# Patient Record
Sex: Male | Born: 1960 | Race: Black or African American | Hispanic: No | Marital: Single | Smoking: Current every day smoker
Health system: Southern US, Community
[De-identification: ages and names within clinical notes are randomized; demographics above are authoritative.]

## PROBLEM LIST (undated history)

## (undated) DIAGNOSIS — I10 Essential (primary) hypertension: Secondary | ICD-10-CM

## (undated) DIAGNOSIS — K219 Gastro-esophageal reflux disease without esophagitis: Secondary | ICD-10-CM

---

## 2015-11-17 ENCOUNTER — Observation Stay (HOSPITAL_COMMUNITY)
Admission: EM | Admit: 2015-11-17 | Discharge: 2015-11-19 | Disposition: A | Discharge status: Home / Self Care | Payer: BLUE CROSS/BLUE SHIELD | Attending: Surgery | Admitting: Surgery

## 2015-11-17 ENCOUNTER — Encounter (HOSPITAL_COMMUNITY): Payer: Self-pay | Admitting: Emergency Medicine

## 2015-11-17 ENCOUNTER — Emergency Department (HOSPITAL_COMMUNITY): Payer: BLUE CROSS/BLUE SHIELD

## 2015-11-17 DIAGNOSIS — K353 Acute appendicitis with localized peritonitis: Secondary | ICD-10-CM | POA: Diagnosis not present

## 2015-11-17 DIAGNOSIS — I4891 Unspecified atrial fibrillation: Secondary | ICD-10-CM | POA: Diagnosis not present

## 2015-11-17 DIAGNOSIS — I10 Essential (primary) hypertension: Secondary | ICD-10-CM | POA: Insufficient documentation

## 2015-11-17 DIAGNOSIS — F17210 Nicotine dependence, cigarettes, uncomplicated: Secondary | ICD-10-CM | POA: Diagnosis not present

## 2015-11-17 DIAGNOSIS — K219 Gastro-esophageal reflux disease without esophagitis: Secondary | ICD-10-CM | POA: Diagnosis not present

## 2015-11-17 DIAGNOSIS — Z79899 Other long term (current) drug therapy: Secondary | ICD-10-CM | POA: Insufficient documentation

## 2015-11-17 DIAGNOSIS — Z23 Encounter for immunization: Secondary | ICD-10-CM | POA: Insufficient documentation

## 2015-11-17 DIAGNOSIS — K388 Other specified diseases of appendix: Secondary | ICD-10-CM | POA: Diagnosis not present

## 2015-11-17 DIAGNOSIS — R079 Chest pain, unspecified: Secondary | ICD-10-CM | POA: Diagnosis present

## 2015-11-17 DIAGNOSIS — K3580 Unspecified acute appendicitis: Secondary | ICD-10-CM | POA: Diagnosis present

## 2015-11-17 HISTORY — DX: Gastro-esophageal reflux disease without esophagitis: K21.9

## 2015-11-17 HISTORY — DX: Essential (primary) hypertension: I10

## 2015-11-17 LAB — I-STAT TROPONIN, ED: Troponin i, poc: 0 ng/mL (ref 0.00–0.08)

## 2015-11-17 LAB — URINALYSIS, ROUTINE W REFLEX MICROSCOPIC
Bilirubin Urine: NEGATIVE
Glucose, UA: NEGATIVE mg/dL
Hgb urine dipstick: NEGATIVE
KETONES UR: NEGATIVE mg/dL
LEUKOCYTES UA: NEGATIVE
Nitrite: NEGATIVE
PROTEIN: NEGATIVE mg/dL
Specific Gravity, Urine: 1.018 (ref 1.005–1.030)
pH: 7 (ref 5.0–8.0)

## 2015-11-17 LAB — HEPATIC FUNCTION PANEL
ALT: 28 U/L (ref 17–63)
AST: 34 U/L (ref 15–41)
Albumin: 4.3 g/dL (ref 3.5–5.0)
Alkaline Phosphatase: 54 U/L (ref 38–126)
Bilirubin, Direct: <0.1 mg/dL — ABNORMAL LOW (ref 0.1–0.5)
TOTAL PROTEIN: 7.4 g/dL (ref 6.5–8.1)
Total Bilirubin: 0.7 mg/dL (ref 0.3–1.2)

## 2015-11-17 LAB — BASIC METABOLIC PANEL
Anion gap: 13 (ref 5–15)
BUN: 9 mg/dL (ref 6–20)
CALCIUM: 9.5 mg/dL (ref 8.9–10.3)
CO2: 23 mmol/L (ref 22–32)
CREATININE: 1.44 mg/dL — AB (ref 0.61–1.24)
Chloride: 105 mmol/L (ref 101–111)
GFR calc Af Amer: >60 mL/min (ref >60)
GFR, EST NON AFRICAN AMERICAN: 54 mL/min — AB (ref >60)
GLUCOSE: 115 mg/dL — AB (ref 65–99)
Potassium: 4.1 mmol/L (ref 3.5–5.1)
Sodium: 141 mmol/L (ref 135–145)

## 2015-11-17 LAB — LIPASE, BLOOD: LIPASE: 21 U/L (ref 11–51)

## 2015-11-17 LAB — CBC
HCT: 47.8 % (ref 39.0–52.0)
HEMOGLOBIN: 15.7 g/dL (ref 13.0–17.0)
MCH: 24.3 pg — ABNORMAL LOW (ref 26.0–34.0)
MCHC: 32.8 g/dL (ref 30.0–36.0)
MCV: 74 fL — ABNORMAL LOW (ref 78.0–100.0)
PLATELETS: 277 10*3/uL (ref 150–400)
RBC: 6.46 MIL/uL — AB (ref 4.22–5.81)
RDW: 14.5 % (ref 11.5–15.5)
WBC: 10.4 10*3/uL (ref 4.0–10.5)

## 2015-11-17 LAB — POC OCCULT BLOOD, ED: Fecal Occult Bld: POSITIVE — AB

## 2015-11-17 MED ORDER — SODIUM CHLORIDE 0.9 % IV BOLUS (SEPSIS)
1000.0000 mL | Freq: Once | INTRAVENOUS | Status: AC
  Administered 2015-11-17: 1000 mL via INTRAVENOUS

## 2015-11-17 MED ORDER — ONDANSETRON 4 MG PO TBDP
4.0000 mg | ORAL_TABLET | Freq: Once | ORAL | Status: AC
  Administered 2015-11-17: 4 mg via ORAL

## 2015-11-17 MED ORDER — FENTANYL CITRATE (PF) 100 MCG/2ML IJ SOLN
INTRAMUSCULAR | Status: AC
  Filled 2015-11-17: qty 2

## 2015-11-17 MED ORDER — IOHEXOL 300 MG/ML  SOLN
100.0000 mL | Freq: Once | INTRAMUSCULAR | Status: AC | PRN
  Administered 2015-11-17: 100 mL via INTRAVENOUS

## 2015-11-17 MED ORDER — SODIUM CHLORIDE 0.9 % IV SOLN
INTRAVENOUS | Status: DC
  Administered 2015-11-17: 22:00:00 via INTRAVENOUS

## 2015-11-17 MED ORDER — ONDANSETRON 4 MG PO TBDP
ORAL_TABLET | ORAL | Status: AC
  Filled 2015-11-17: qty 1

## 2015-11-17 MED ORDER — FENTANYL CITRATE (PF) 100 MCG/2ML IJ SOLN
50.0000 ug | Freq: Once | INTRAMUSCULAR | Status: AC
  Administered 2015-11-17: 50 ug via NASAL

## 2015-11-17 MED ORDER — ONDANSETRON HCL 4 MG/2ML IJ SOLN
4.0000 mg | Freq: Once | INTRAMUSCULAR | Status: AC
  Administered 2015-11-17: 4 mg via INTRAVENOUS
  Filled 2015-11-17: qty 2

## 2015-11-17 MED ORDER — HYDROMORPHONE HCL 1 MG/ML IJ SOLN
1.0000 mg | Freq: Once | INTRAMUSCULAR | Status: AC
  Administered 2015-11-17: 1 mg via INTRAVENOUS
  Filled 2015-11-17: qty 1

## 2015-11-17 NOTE — ED Provider Notes (Signed)
CSN: [id number]     Arrival date & time 11/17/15  1627 History   First MD Initiated Contact with Patient 11/17/15 2044     Chief Complaint  Patient presents with  . Chest Pain  . Back Pain     (Consider location/radiation/quality/duration/timing/severity/associated sxs/prior Treatment) HPI   Anthony Marks is a 55 y.o. male who presents for evaluation of central chest pain, radiating to the back and upper abdomen, which started yesterday and has been persistent. 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 He had several episodes of vomiting food colored material and several episodes of loose bowel movements with "blood". He denies similar symptoms. There's been no fever, chills, cough, weakness or dizziness. He smokes cigarettes. He does not drink alcohol or take illegal drugs. There are no other known modifying factors.   Past Medical History  Diagnosis Date  . Hypertension   . GERD (gastroesophageal reflux disease)    History reviewed. No pertinent past surgical history. No family history on file. Social History  Substance Use Topics  . Smoking status: Current Every Day Smoker -- 0.50 packs/day    Types: Cigarettes  . Smokeless tobacco: None  . Alcohol Use: No    Review of Systems  All other systems reviewed and are negative.     Allergies  Review of patient's allergies indicates no known allergies.  Home Medications   Prior to Admission medications   Not on File   BP 136/93 mmHg  Pulse 82  Temp(Src) 98.8 F (37.1 C) (Oral)  Resp 18  Ht 6\' 5"  (1.956 m)  Wt 245 lb (111.131 kg)  BMI 29.05 kg/m2  SpO2 98% Physical Exam  Constitutional: He is oriented to person, place, and time. He appears well-developed and well-nourished.  HENT:  Head: Normocephalic and atraumatic.  Right Ear: External ear normal.  Left Ear: External ear normal.  Eyes: Conjunctivae and EOM are normal. Pupils are  equal, round, and reactive to light.  Neck: Normal range of motion and phonation normal. Neck supple.  Cardiovascular: Normal rate, regular rhythm and normal heart sounds.   Pulmonary/Chest: Effort normal and breath sounds normal. He exhibits no bony tenderness.  Abdominal: Soft. He exhibits no distension. There is tenderness (right and left upper quadrant, mild). There is no rebound and no guarding.  Genitourinary:  Small amount of brown stool rectal vault. No rectal mass, tenderness, or impaction  Musculoskeletal: Normal range of motion.  Neurological: He is alert and oriented to person, place, and time. No cranial nerve deficit or sensory deficit. He exhibits normal muscle tone. Coordination normal.  Skin: Skin is warm, dry and intact.  Psychiatric: He has a normal mood and affect. His behavior is normal. Judgment and thought content normal.  Nursing note and vitals reviewed.   ED Course  Procedures (including critical care time) Medications  fentaNYL (SUBLIMAZE) 100 MCG/2ML injection (not administered)  ondansetron (ZOFRAN-ODT) 4 MG disintegrating tablet (not administered)  ondansetron (ZOFRAN) injection 4 mg (not administered)  HYDROmorphone (DILAUDID) injection 1 mg (not administered)  sodium chloride 0.9 % bolus 1,000 mL (not administered)  0.9 %  sodium chloride infusion (not administered)  fentaNYL (SUBLIMAZE) injection 50 mcg (50 mcg Nasal Given 11/17/15 1645)  ondansetron (ZOFRAN-ODT) disintegrating tablet 4 mg (4 mg Oral Given 11/17/15 1645)    Patient Vitals for the past 24 hrs:  BP Temp Temp src Pulse Resp SpO2 Height Weight  11/17/15 2019 136/93  mmHg 98.8 F (37.1 C) Oral 82 18 98 % - -  11/17/15 1639 (!) 143/105 mmHg 97.8 F (36.6 C) Oral 70 16 100 % 6\' 5"  (1.956 m) 245 lb (111.131 kg)   12:46 AM-Consult complete with Dr. Andrey Campanile. Patient case explained and discussed. He agrees to admit patient for further evaluation and treatment. Call ended at 0100   12:51 AM  Reevaluation with update and discussion. After initial assessment and treatment, an updated evaluation reveals no change in clinical status. He does now have focal RLQ tenderness. Anthony Marks      Labs Review Labs Reviewed  BASIC METABOLIC PANEL - Abnormal; Notable for the following:    Glucose, Bld 115 (*)    Creatinine, Ser 1.44 (*)    GFR calc non Af Amer 54 (*)    All other components within normal limits  CBC - Abnormal; Notable for the following:    RBC 6.46 (*)    MCV 74.0 (*)    MCH 24.3 (*)    All other components within normal limits  Rosezena Sensor, ED    Imaging Review Dg Chest 2 View  11/17/2015  CLINICAL DATA:  Abdominal and posterior thorax pain. Nausea, vomiting and diarrhea. Symptoms for 1 day. Initial encounter. EXAM: CHEST  2 VIEW COMPARISON:  None. FINDINGS: The lungs are clear. Heart size is normal. No pneumothorax or pleural effusion. No focal bony abnormality. IMPRESSION: No acute disease. Electronically Signed   By: Drusilla Kanner M.D.   On: 11/17/2015 17:39   I have personally reviewed and evaluated these images and lab results as part of my medical decision-making.   EKG Interpretation   Date/Time:  Thursday November 17 2015 21:00:49 EST Ventricular Rate:  74 PR Interval:  185 QRS Duration: 96 QT Interval:  378 QTC Calculation: 419 R Axis:   62 Text Interpretation:  Sinus rhythm Since last tracing of earlier today No  significant change was found Confirmed by Effie Shy  MD, Cale Bethard [phone]) on  11/17/2015 9:10:17 PM         MDM   Final diagnoses:  Acute appendicitis, unspecified acute appendicitis type    Abdominal pain with vomiting and diarrhea. CT imaging consistent with acute appendicitis, without perforation.  Nursing Notes Reviewed/ Care Coordinated, and agree without changes. Applicable Imaging Reviewed.  Interpretation of Laboratory Data incorporated into ED treatment  Plan- as per consulting general surgeon, Dr.  Leafy Half, MD 11/18/15 1306

## 2015-11-17 NOTE — ED Notes (Signed)
Pt states he started having substernal sharp chest pain yesterday that is radiating into his back and upper abd. Pt states he has vomitted once. Pt states he has had two episodes of bright red blood in his stool today also.

## 2015-11-18 ENCOUNTER — Observation Stay (HOSPITAL_COMMUNITY): Payer: BLUE CROSS/BLUE SHIELD | Admitting: Anesthesiology

## 2015-11-18 ENCOUNTER — Encounter (HOSPITAL_COMMUNITY): Payer: Self-pay | Admitting: Certified Registered Nurse Anesthetist

## 2015-11-18 ENCOUNTER — Encounter (HOSPITAL_COMMUNITY)
Admission: EM | Disposition: A | Discharge status: Home / Self Care | Payer: Self-pay | Source: Home / Self Care | Attending: Emergency Medicine

## 2015-11-18 DIAGNOSIS — I4891 Unspecified atrial fibrillation: Secondary | ICD-10-CM | POA: Diagnosis present

## 2015-11-18 DIAGNOSIS — K3580 Unspecified acute appendicitis: Secondary | ICD-10-CM | POA: Diagnosis present

## 2015-11-18 HISTORY — PX: LAPAROSCOPIC APPENDECTOMY: SHX408

## 2015-11-18 LAB — BASIC METABOLIC PANEL
ANION GAP: 7 (ref 5–15)
BUN: 8 mg/dL (ref 6–20)
CHLORIDE: 107 mmol/L (ref 101–111)
CO2: 25 mmol/L (ref 22–32)
Calcium: 8.3 mg/dL — ABNORMAL LOW (ref 8.9–10.3)
Creatinine, Ser: 1.33 mg/dL — ABNORMAL HIGH (ref 0.61–1.24)
GFR calc non Af Amer: 59 mL/min — ABNORMAL LOW (ref >60)
GLUCOSE: 99 mg/dL (ref 65–99)
POTASSIUM: 3.7 mmol/L (ref 3.5–5.1)
Sodium: 139 mmol/L (ref 135–145)

## 2015-11-18 LAB — CBC
HEMATOCRIT: 42.9 % (ref 39.0–52.0)
HEMOGLOBIN: 13.8 g/dL (ref 13.0–17.0)
MCH: 24.2 pg — AB (ref 26.0–34.0)
MCHC: 32.2 g/dL (ref 30.0–36.0)
MCV: 75.1 fL — ABNORMAL LOW (ref 78.0–100.0)
Platelets: 236 10*3/uL (ref 150–400)
RBC: 5.71 MIL/uL (ref 4.22–5.81)
RDW: 14.6 % (ref 11.5–15.5)
WBC: 11.3 10*3/uL — ABNORMAL HIGH (ref 4.0–10.5)

## 2015-11-18 LAB — SURGICAL PCR SCREEN
MRSA, PCR: NEGATIVE
Staphylococcus aureus: POSITIVE — AB

## 2015-11-18 SURGERY — APPENDECTOMY, LAPAROSCOPIC
Anesthesia: General | Site: Abdomen

## 2015-11-18 MED ORDER — PROPOFOL 10 MG/ML IV BOLUS
INTRAVENOUS | Status: DC | PRN
  Administered 2015-11-18: 200 mg via INTRAVENOUS

## 2015-11-18 MED ORDER — DEXTROSE-NACL 5-0.9 % IV SOLN
INTRAVENOUS | Status: DC
  Administered 2015-11-18: 75 mL/h via INTRAVENOUS

## 2015-11-18 MED ORDER — ROCURONIUM BROMIDE 50 MG/5ML IV SOLN
INTRAVENOUS | Status: AC
  Filled 2015-11-18: qty 1

## 2015-11-18 MED ORDER — ROCURONIUM BROMIDE 100 MG/10ML IV SOLN
INTRAVENOUS | Status: DC | PRN
  Administered 2015-11-18: 30 mg via INTRAVENOUS

## 2015-11-18 MED ORDER — NICOTINE 14 MG/24HR TD PT24
14.0000 mg | MEDICATED_PATCH | Freq: Every day | TRANSDERMAL | Status: DC
  Administered 2015-11-19: 14 mg via TRANSDERMAL
  Filled 2015-11-18: qty 1

## 2015-11-18 MED ORDER — SUCCINYLCHOLINE CHLORIDE 20 MG/ML IJ SOLN
INTRAMUSCULAR | Status: AC
  Filled 2015-11-18: qty 1

## 2015-11-18 MED ORDER — LACTATED RINGERS IV SOLN
INTRAVENOUS | Status: DC | PRN
  Administered 2015-11-18: 10:00:00 via INTRAVENOUS

## 2015-11-18 MED ORDER — SODIUM CHLORIDE 0.9 % IR SOLN
Status: DC | PRN
  Administered 2015-11-18: 1000 mL

## 2015-11-18 MED ORDER — BUPIVACAINE HCL (PF) 0.25 % IJ SOLN
INTRAMUSCULAR | Status: AC
  Filled 2015-11-18: qty 30

## 2015-11-18 MED ORDER — PANTOPRAZOLE SODIUM 40 MG IV SOLR
40.0000 mg | Freq: Every day | INTRAVENOUS | Status: DC
  Administered 2015-11-18: 40 mg via INTRAVENOUS
  Filled 2015-11-18: qty 40

## 2015-11-18 MED ORDER — HYDROMORPHONE HCL 1 MG/ML IJ SOLN
0.5000 mg | INTRAMUSCULAR | Status: DC | PRN
  Administered 2015-11-18: 1 mg via INTRAVENOUS
  Filled 2015-11-18: qty 1

## 2015-11-18 MED ORDER — SUCCINYLCHOLINE CHLORIDE 20 MG/ML IJ SOLN
INTRAMUSCULAR | Status: DC | PRN
  Administered 2015-11-18: 100 mg via INTRAVENOUS

## 2015-11-18 MED ORDER — ONDANSETRON HCL 4 MG/2ML IJ SOLN
INTRAMUSCULAR | Status: DC | PRN
  Administered 2015-11-18: 4 mg via INTRAVENOUS

## 2015-11-18 MED ORDER — LACTATED RINGERS IV SOLN: INTRAVENOUS | Status: DC

## 2015-11-18 MED ORDER — NEOSTIGMINE METHYLSULFATE 10 MG/10ML IV SOLN
INTRAVENOUS | Status: DC | PRN
  Administered 2015-11-18: 4 mg via INTRAVENOUS

## 2015-11-18 MED ORDER — MIDAZOLAM HCL 2 MG/2ML IJ SOLN
INTRAMUSCULAR | Status: AC
  Filled 2015-11-18: qty 2

## 2015-11-18 MED ORDER — LIDOCAINE HCL (CARDIAC) 20 MG/ML IV SOLN
INTRAVENOUS | Status: AC
  Filled 2015-11-18: qty 5

## 2015-11-18 MED ORDER — ONDANSETRON 4 MG PO TBDP
4.0000 mg | ORAL_TABLET | Freq: Four times a day (QID) | ORAL | Status: DC | PRN
  Filled 2015-11-18: qty 1

## 2015-11-18 MED ORDER — SODIUM CHLORIDE 0.9 % IV SOLN
INTRAVENOUS | Status: AC
  Administered 2015-11-18: 03:00:00 via INTRAVENOUS

## 2015-11-18 MED ORDER — EPHEDRINE SULFATE 50 MG/ML IJ SOLN
INTRAMUSCULAR | Status: AC
  Filled 2015-11-18: qty 1

## 2015-11-18 MED ORDER — DIPHENHYDRAMINE HCL 50 MG/ML IJ SOLN: 12.5000 mg | Freq: Four times a day (QID) | INTRAMUSCULAR | Status: DC | PRN

## 2015-11-18 MED ORDER — GLYCOPYRROLATE 0.2 MG/ML IJ SOLN
INTRAMUSCULAR | Status: DC | PRN
  Administered 2015-11-18: 0.6 mg via INTRAVENOUS

## 2015-11-18 MED ORDER — PROPOFOL 10 MG/ML IV BOLUS
INTRAVENOUS | Status: AC
  Filled 2015-11-18: qty 20

## 2015-11-18 MED ORDER — 0.9 % SODIUM CHLORIDE (POUR BTL) OPTIME
TOPICAL | Status: DC | PRN
  Administered 2015-11-18: 1000 mL

## 2015-11-18 MED ORDER — DIPHENHYDRAMINE HCL 12.5 MG/5ML PO ELIX: 12.5000 mg | ORAL_SOLUTION | Freq: Four times a day (QID) | ORAL | Status: DC | PRN

## 2015-11-18 MED ORDER — ONDANSETRON HCL 4 MG/2ML IJ SOLN
4.0000 mg | Freq: Four times a day (QID) | INTRAMUSCULAR | Status: DC | PRN
  Filled 2015-11-18: qty 2

## 2015-11-18 MED ORDER — ONDANSETRON HCL 4 MG/2ML IJ SOLN
4.0000 mg | Freq: Four times a day (QID) | INTRAMUSCULAR | Status: DC | PRN
  Administered 2015-11-18: 4 mg via INTRAVENOUS

## 2015-11-18 MED ORDER — HYDROMORPHONE HCL 1 MG/ML IJ SOLN: 0.2500 mg | INTRAMUSCULAR | Status: DC | PRN

## 2015-11-18 MED ORDER — MEPERIDINE HCL 25 MG/ML IJ SOLN: 6.2500 mg | INTRAMUSCULAR | Status: DC | PRN

## 2015-11-18 MED ORDER — FENTANYL CITRATE (PF) 100 MCG/2ML IJ SOLN
INTRAMUSCULAR | Status: DC | PRN
  Administered 2015-11-18 (×2): 50 ug via INTRAVENOUS

## 2015-11-18 MED ORDER — ONDANSETRON HCL 4 MG/2ML IJ SOLN
INTRAMUSCULAR | Status: AC
Start: 2015-11-18 — End: 2015-11-18
  Filled 2015-11-18: qty 2

## 2015-11-18 MED ORDER — GLYCOPYRROLATE 0.2 MG/ML IJ SOLN
INTRAMUSCULAR | Status: AC
  Filled 2015-11-18: qty 3

## 2015-11-18 MED ORDER — OXYCODONE HCL 5 MG PO TABS
5.0000 mg | ORAL_TABLET | ORAL | Status: DC | PRN
  Administered 2015-11-18 – 2015-11-19 (×3): 10 mg via ORAL
  Filled 2015-11-18 (×3): qty 2

## 2015-11-18 MED ORDER — BUPIVACAINE HCL 0.25 % IJ SOLN
INTRAMUSCULAR | Status: DC | PRN
  Administered 2015-11-18: 30 mL

## 2015-11-18 MED ORDER — METRONIDAZOLE IN NACL 5-0.79 MG/ML-% IV SOLN
500.0000 mg | Freq: Three times a day (TID) | INTRAVENOUS | Status: DC
  Administered 2015-11-18 – 2015-11-19 (×4): 500 mg via INTRAVENOUS
  Filled 2015-11-18 (×7): qty 100

## 2015-11-18 MED ORDER — PHENYLEPHRINE 40 MCG/ML (10ML) SYRINGE FOR IV PUSH (FOR BLOOD PRESSURE SUPPORT)
PREFILLED_SYRINGE | INTRAVENOUS | Status: AC
  Filled 2015-11-18: qty 10

## 2015-11-18 MED ORDER — PNEUMOCOCCAL VAC POLYVALENT 25 MCG/0.5ML IJ INJ
0.5000 mL | INJECTION | INTRAMUSCULAR | Status: AC
  Administered 2015-11-19: 0.5 mL via INTRAMUSCULAR
  Filled 2015-11-18: qty 0.5

## 2015-11-18 MED ORDER — NEOSTIGMINE METHYLSULFATE 10 MG/10ML IV SOLN
INTRAVENOUS | Status: AC
  Filled 2015-11-18: qty 1

## 2015-11-18 MED ORDER — FENTANYL CITRATE (PF) 250 MCG/5ML IJ SOLN
INTRAMUSCULAR | Status: AC
  Filled 2015-11-18: qty 5

## 2015-11-18 MED ORDER — MORPHINE SULFATE (PF) 2 MG/ML IV SOLN
1.0000 mg | INTRAVENOUS | Status: DC | PRN
  Administered 2015-11-18 (×2): 2 mg via INTRAVENOUS
  Administered 2015-11-18: 3 mg via INTRAVENOUS
  Administered 2015-11-19: 2 mg via INTRAVENOUS
  Filled 2015-11-18 (×3): qty 1
  Filled 2015-11-18: qty 2

## 2015-11-18 MED ORDER — LACTATED RINGERS IV SOLN
INTRAVENOUS | Status: DC
  Administered 2015-11-18: 09:00:00 via INTRAVENOUS

## 2015-11-18 MED ORDER — DEXAMETHASONE SODIUM PHOSPHATE 10 MG/ML IJ SOLN
INTRAMUSCULAR | Status: AC
  Filled 2015-11-18: qty 1

## 2015-11-18 MED ORDER — LIDOCAINE HCL (CARDIAC) 20 MG/ML IV SOLN
INTRAVENOUS | Status: DC | PRN
  Administered 2015-11-18: 50 mg via INTRAVENOUS

## 2015-11-18 MED ORDER — DEXTROSE 5 % IV SOLN
2.0000 g | INTRAVENOUS | Status: DC
  Administered 2015-11-18 – 2015-11-19 (×2): 2 g via INTRAVENOUS
  Filled 2015-11-18 (×2): qty 2

## 2015-11-18 MED ORDER — PROMETHAZINE HCL 25 MG/ML IJ SOLN: 6.2500 mg | INTRAMUSCULAR | Status: DC | PRN

## 2015-11-18 MED ORDER — MIDAZOLAM HCL 5 MG/5ML IJ SOLN
INTRAMUSCULAR | Status: DC | PRN
  Administered 2015-11-18: 2 mg via INTRAVENOUS

## 2015-11-18 MED ORDER — HYDROMORPHONE HCL 1 MG/ML IJ SOLN
INTRAMUSCULAR | Status: AC
  Filled 2015-11-18: qty 1

## 2015-11-18 MED ORDER — DEXAMETHASONE SODIUM PHOSPHATE 10 MG/ML IJ SOLN
INTRAMUSCULAR | Status: DC | PRN
  Administered 2015-11-18: 10 mg via INTRAVENOUS

## 2015-11-18 MED ORDER — PROMETHAZINE HCL 25 MG/ML IJ SOLN
12.5000 mg | Freq: Four times a day (QID) | INTRAMUSCULAR | Status: DC | PRN
  Administered 2015-11-18: 12.5 mg via INTRAVENOUS
  Filled 2015-11-18: qty 1

## 2015-11-18 MED ORDER — HYDROMORPHONE HCL 1 MG/ML IJ SOLN
1.0000 mg | INTRAMUSCULAR | Status: DC | PRN
  Administered 2015-11-18: 1 mg via INTRAVENOUS

## 2015-11-18 SURGICAL SUPPLY — 46 items
APPLIER CLIP 5 13 M/L LIGAMAX5 (MISCELLANEOUS)
BENZOIN TINCTURE PRP APPL 2/3 (GAUZE/BANDAGES/DRESSINGS) ×2 IMPLANT
BLADE SURG ROTATE 9660 (MISCELLANEOUS) IMPLANT
CANISTER SUCTION 2500CC (MISCELLANEOUS) ×2 IMPLANT
CHLORAPREP W/TINT 26ML (MISCELLANEOUS) ×2 IMPLANT
CLIP APPLIE 5 13 M/L LIGAMAX5 (MISCELLANEOUS) IMPLANT
COVER SURGICAL LIGHT HANDLE (MISCELLANEOUS) ×2 IMPLANT
COVER TRANSDUCER ULTRASND (DRAPES) ×2 IMPLANT
DEVICE TROCAR PUNCTURE CLOSURE (ENDOMECHANICALS) ×2 IMPLANT
DRAPE LAPAROSCOPIC ABDOMINAL (DRAPES) ×2 IMPLANT
ELECT REM PT RETURN 9FT ADLT (ELECTROSURGICAL) ×2
ELECTRODE REM PT RTRN 9FT ADLT (ELECTROSURGICAL) ×1 IMPLANT
ENDOLOOP SUT PDS II  0 18 (SUTURE) ×3
ENDOLOOP SUT PDS II 0 18 (SUTURE) ×3 IMPLANT
GAUZE SPONGE 2X2 8PLY STRL LF (GAUZE/BANDAGES/DRESSINGS) ×1 IMPLANT
GLOVE BIO SURGEON STRL SZ7.5 (GLOVE) ×2 IMPLANT
GLOVE BIOGEL PI IND STRL 7.0 (GLOVE) ×2 IMPLANT
GLOVE BIOGEL PI IND STRL 7.5 (GLOVE) ×1 IMPLANT
GLOVE BIOGEL PI IND STRL 8 (GLOVE) ×2 IMPLANT
GLOVE BIOGEL PI INDICATOR 7.0 (GLOVE) ×2
GLOVE BIOGEL PI INDICATOR 7.5 (GLOVE) ×1
GLOVE BIOGEL PI INDICATOR 8 (GLOVE) ×2
GOWN STRL REUS W/ TWL LRG LVL3 (GOWN DISPOSABLE) ×2 IMPLANT
GOWN STRL REUS W/ TWL XL LVL3 (GOWN DISPOSABLE) ×1 IMPLANT
GOWN STRL REUS W/TWL LRG LVL3 (GOWN DISPOSABLE) ×2
GOWN STRL REUS W/TWL XL LVL3 (GOWN DISPOSABLE) ×1
KIT BASIN OR (CUSTOM PROCEDURE TRAY) ×2 IMPLANT
KIT ROOM TURNOVER OR (KITS) ×2 IMPLANT
NEEDLE INSUFFLATION 14GA 120MM (NEEDLE) ×2 IMPLANT
NS IRRIG 1000ML POUR BTL (IV SOLUTION) ×2 IMPLANT
PAD ARMBOARD 7.5X6 YLW CONV (MISCELLANEOUS) ×4 IMPLANT
SCISSORS LAP 5X35 DISP (ENDOMECHANICALS) ×2 IMPLANT
SET IRRIG TUBING LAPAROSCOPIC (IRRIGATION / IRRIGATOR) ×2 IMPLANT
SLEEVE ENDOPATH XCEL 5M (ENDOMECHANICALS) ×2 IMPLANT
SPECIMEN JAR SMALL (MISCELLANEOUS) ×2 IMPLANT
SPONGE GAUZE 2X2 STER 10/PKG (GAUZE/BANDAGES/DRESSINGS) ×1
STRIP CLOSURE SKIN 1/2X4 (GAUZE/BANDAGES/DRESSINGS) ×2 IMPLANT
SUT MNCRL AB 3-0 PS2 18 (SUTURE) ×2 IMPLANT
SUT SILK 2 0 SH (SUTURE) IMPLANT
TOWEL OR 17X24 6PK STRL BLUE (TOWEL DISPOSABLE) ×2 IMPLANT
TOWEL OR 17X26 10 PK STRL BLUE (TOWEL DISPOSABLE) ×2 IMPLANT
TRAY FOLEY CATH 16FR SILVER (SET/KITS/TRAYS/PACK) ×2 IMPLANT
TRAY LAPAROSCOPIC MC (CUSTOM PROCEDURE TRAY) ×2 IMPLANT
TROCAR XCEL NON-BLD 11X100MML (ENDOMECHANICALS) ×2 IMPLANT
TROCAR XCEL NON-BLD 5MMX100MML (ENDOMECHANICALS) ×2 IMPLANT
TUBING INSUFFLATION (TUBING) ×2 IMPLANT

## 2015-11-18 NOTE — Anesthesia Preprocedure Evaluation (Addendum)
Anesthesia Evaluation  Patient identified by MRN, date of birth, ID band Patient awake    Reviewed: Allergy & Precautions, NPO status , Patient's Chart, lab work & pertinent test results  Airway Mallampati: I  TM Distance: >3 FB Neck ROM: Full    Dental  (+) Missing, Dental Advisory Given,    Pulmonary Current Smoker,    breath sounds clear to auscultation       Cardiovascular hypertension, + dysrhythmias Atrial Fibrillation  Rhythm:Regular     Neuro/Psych negative neurological ROS  negative psych ROS   GI/Hepatic Neg liver ROS, GERD  Medicated,  Endo/Other  negative endocrine ROS  Renal/GU negative Renal ROS  negative genitourinary   Musculoskeletal negative musculoskeletal ROS (+)   Abdominal   Peds negative pediatric ROS (+)  Hematology negative hematology ROS (+)   Anesthesia Other Findings   Reproductive/Obstetrics negative OB ROS                            Lab Results  Component Value Date   WBC 11.3* 11/18/2015   HGB 13.8 11/18/2015   HCT 42.9 11/18/2015   MCV 75.1* 11/18/2015   PLT 236 11/18/2015   Lab Results  Component Value Date   CREATININE 1.33* 11/18/2015   BUN 8 11/18/2015   NA 139 11/18/2015   K 3.7 11/18/2015   CL 107 11/18/2015   CO2 25 11/18/2015   No results found for: INR, PROTIME  11/2015 EKG: normal sinus rhythm.   Anesthesia Physical Anesthesia Plan  ASA: III  Anesthesia Plan: General   Post-op Pain Management:    Induction: Intravenous  Airway Management Planned: Oral ETT  Additional Equipment:   Intra-op Plan:   Post-operative Plan: Extubation in OR  Informed Consent: I have reviewed the patients History and Physical, chart, labs and discussed the procedure including the risks, benefits and alternatives for the proposed anesthesia with the patient or authorized representative who has indicated his/her understanding and acceptance.    Dental advisory given  Plan Discussed with: CRNA  Anesthesia Plan Comments: (Mr. Barnabas ListerWashington denies HTN diagnosis.)       Anesthesia Quick Evaluation

## 2015-11-18 NOTE — Addendum Note (Signed)
Addendum  created 11/18/15 1243 by Epifanio LeschesKari L Miranda Garber, CRNA   Modules edited: Anesthesia Medication Administration

## 2015-11-18 NOTE — H&P (Signed)
Anthony Marks is an 55 y.o. male.   Chief Complaint: abd pain3 HPI: 55 yo male came to ED because of ongoing upper abd and back pain which started about 1.5 days ago. Had some n/v/diarrhea. Nothing like this in the past. No fever/chills. Also reports some small amount of blood in stool. No CP/SOB/orthopnea/PND. +smoker. No prior surgery on abd.   Past Medical History  Diagnosis Date  . Hypertension   . GERD (gastroesophageal reflux disease)     History reviewed. No pertinent past surgical history.  No family history on file. Social History:  reports that he has been smoking Cigarettes.  He has been smoking about 0.50 packs per day. He does not have any smokeless tobacco history on file. He reports that he does not drink alcohol. His drug history is not on file.  Allergies: No Known Allergies  Medications Prior to Admission  Medication Sig Dispense Refill  . diphenhydramine-acetaminophen (TYLENOL PM) 25-500 MG TABS tablet Take 1 tablet by mouth at bedtime as needed.    . ranitidine (ZANTAC) 150 MG tablet Take 150 mg by mouth daily as needed for heartburn.    . Sodium & Potassium Bicarbonate (ALKA-SELTZER GOLD PO) Take 1 tablet by mouth daily as needed. For cold symptoms      Results for orders placed or performed during the hospital encounter of 11/17/15 (from the past 48 hour(s))  Basic metabolic panel     Status: Abnormal   Collection Time: 11/17/15  4:36 PM  Result Value Ref Range   Sodium 141 135 - 145 mmol/L   Potassium 4.1 3.5 - 5.1 mmol/L   Chloride 105 101 - 111 mmol/L   CO2 23 22 - 32 mmol/L   Glucose, Bld 115 (H) 65 - 99 mg/dL   BUN 9 6 - 20 mg/dL   Creatinine, Ser 1.44 (H) 0.61 - 1.24 mg/dL   Calcium 9.5 8.9 - 10.3 mg/dL   GFR calc non Af Amer 54 (L) >60 mL/min   GFR calc Af Amer >60 >60 mL/min    Comment: (NOTE) The eGFR has been calculated using the CKD EPI equation. This calculation has not been validated in all clinical situations. eGFR's persistently <60  mL/min signify possible Chronic Kidney Disease.    Anion gap 13 5 - 15  CBC     Status: Abnormal   Collection Time: 11/17/15  4:36 PM  Result Value Ref Range   WBC 10.4 4.0 - 10.5 K/uL   RBC 6.46 (H) 4.22 - 5.81 MIL/uL   Hemoglobin 15.7 13.0 - 17.0 g/dL   HCT 47.8 39.0 - 52.0 %   MCV 74.0 (L) 78.0 - 100.0 fL   MCH 24.3 (L) 26.0 - 34.0 pg   MCHC 32.8 30.0 - 36.0 g/dL   RDW 14.5 11.5 - 15.5 %   Platelets 277 150 - 400 K/uL  Lipase, blood     Status: None   Collection Time: 11/17/15  4:36 PM  Result Value Ref Range   Lipase 21 11 - 51 U/L  Hepatic function panel     Status: Abnormal   Collection Time: 11/17/15  4:36 PM  Result Value Ref Range   Total Protein 7.4 6.5 - 8.1 g/dL   Albumin 4.3 3.5 - 5.0 g/dL   AST 34 15 - 41 U/L   ALT 28 17 - 63 U/L   Alkaline Phosphatase 54 38 - 126 U/L   Total Bilirubin 0.7 0.3 - 1.2 mg/dL   Bilirubin, Direct <0.1 (L) 0.1 -  0.5 mg/dL   Indirect Bilirubin NOT CALCULATED 0.3 - 0.9 mg/dL  I-stat troponin, ED (not at [facility], [facility])     Status: None   Collection Time: 11/17/15  5:25 PM  Result Value Ref Range   Troponin i, poc 0.00 0.00 - 0.08 ng/mL   Comment 3            Comment: Due to the release kinetics of cTnI, a negative result within the first hours of the onset of symptoms does not rule out myocardial infarction with certainty. If myocardial infarction is still suspected, repeat the test at appropriate intervals.   POC occult blood, ED Provider will collect     Status: Abnormal   Collection Time: 11/17/15 10:25 PM  Result Value Ref Range   Fecal Occult Bld POSITIVE (A) NEGATIVE  Urinalysis, Routine w reflex microscopic     Status: None   Collection Time: 11/17/15 11:41 PM  Result Value Ref Range   Color, Urine YELLOW YELLOW   APPearance CLEAR CLEAR   Specific Gravity, Urine 1.018 1.005 - 1.030   pH 7.0 5.0 - 8.0   Glucose, UA NEGATIVE NEGATIVE mg/dL   Hgb urine dipstick NEGATIVE NEGATIVE   Bilirubin Urine NEGATIVE NEGATIVE    Ketones, ur NEGATIVE NEGATIVE mg/dL   Protein, ur NEGATIVE NEGATIVE mg/dL   Nitrite NEGATIVE NEGATIVE   Leukocytes, UA NEGATIVE NEGATIVE    Comment: MICROSCOPIC NOT DONE ON URINES WITH NEGATIVE PROTEIN, BLOOD, LEUKOCYTES, NITRITE, OR GLUCOSE <1000 mg/dL.  Basic metabolic panel     Status: Abnormal   Collection Time: 11/18/15  6:10 AM  Result Value Ref Range   Sodium 139 135 - 145 mmol/L   Potassium 3.7 3.5 - 5.1 mmol/L   Chloride 107 101 - 111 mmol/L   CO2 25 22 - 32 mmol/L   Glucose, Bld 99 65 - 99 mg/dL   BUN 8 6 - 20 mg/dL   Creatinine, Ser 1.33 (H) 0.61 - 1.24 mg/dL   Calcium 8.3 (L) 8.9 - 10.3 mg/dL   GFR calc non Af Amer 59 (L) >60 mL/min   GFR calc Af Amer >60 >60 mL/min    Comment: (NOTE) The eGFR has been calculated using the CKD EPI equation. This calculation has not been validated in all clinical situations. eGFR's persistently <60 mL/min signify possible Chronic Kidney Disease.    Anion gap 7 5 - 15  CBC     Status: Abnormal   Collection Time: 11/18/15  6:10 AM  Result Value Ref Range   WBC 11.3 (H) 4.0 - 10.5 K/uL   RBC 5.71 4.22 - 5.81 MIL/uL   Hemoglobin 13.8 13.0 - 17.0 g/dL   HCT 42.9 39.0 - 52.0 %   MCV 75.1 (L) 78.0 - 100.0 fL   MCH 24.2 (L) 26.0 - 34.0 pg   MCHC 32.2 30.0 - 36.0 g/dL   RDW 14.6 11.5 - 15.5 %   Platelets 236 150 - 400 K/uL   Dg Chest 2 View  11/17/2015  CLINICAL DATA:  Abdominal and posterior thorax pain. Nausea, vomiting and diarrhea. Symptoms for 1 day. Initial encounter. EXAM: CHEST  2 VIEW COMPARISON:  None. FINDINGS: The lungs are clear. Heart size is normal. No pneumothorax or pleural effusion. No focal bony abnormality. IMPRESSION: No acute disease. Electronically Signed   By: Inge Rise M.D.   On: 11/17/2015 17:39   Ct Abdomen Pelvis W Contrast  11/18/2015  CLINICAL DATA:  Mid abdominal pain radiating to the back. EXAM: CT ABDOMEN AND  PELVIS WITH CONTRAST TECHNIQUE: Multidetector CT imaging of the abdomen and pelvis was  performed using the standard protocol following bolus administration of intravenous contrast. CONTRAST:  121m OMNIPAQUE IOHEXOL 300 MG/ML  SOLN COMPARISON:  None. FINDINGS: Lower chest: Mild atelectasis at the left greater than right lung base. Liver: No focal lesion. Hepatobiliary: Gallbladder physiologically distended, no calcified stone. No biliary dilatation. Pancreas: No ductal dilatation or inflammation. Spleen: Normal. Adrenal glands: No nodule.  Mild thickening on the left. Kidneys: Symmetric renal enhancement and excretion. No hydronephrosis. Stomach/Bowel: Stomach physiologically distended. There are no dilated or thickened small bowel loops. Small volume of stool throughout the colon without colonic wall thickening. Appendix: Distended measuring up to 14 mm with periappendiceal inflammation and fat stranding. No perforation or abscess. Vascular/Lymphatic: No retroperitoneal adenopathy. Abdominal aorta is normal in caliber. Reproductive: Prostate gland is normal in size. Bladder: Minimally distended without wall thickening. Other: No free air, free fluid, or intra-abdominal fluid collection. Tiny fat containing umbilical hernia. Musculoskeletal: There are no acute or suspicious osseous abnormalities. Degenerative change at L5-S1. Milder degenerative church at L3-L4. IMPRESSION: Uncomplicated acute appendicitis. Electronically Signed   By: MJeb LeveringM.D.   On: 11/18/2015 00:26    Review of Systems  Constitutional: Negative for weight loss.  HENT: Negative for nosebleeds.   Eyes: Negative for blurred vision.  Respiratory: Negative for shortness of breath.   Cardiovascular: Negative for chest pain, palpitations, orthopnea and PND.       Denies DOE  Gastrointestinal: Positive for nausea, vomiting, abdominal pain, diarrhea and blood in stool.  Genitourinary: Negative for dysuria and hematuria.  Musculoskeletal: Positive for back pain.  Skin: Negative for itching and rash.  Neurological:  Negative for dizziness, focal weakness, seizures, loss of consciousness and headaches.       Denies TIAs, amaurosis fugax  Endo/Heme/Allergies: Does not bruise/bleed easily.  Psychiatric/Behavioral: The patient is not nervous/anxious.     Blood pressure 145/61, pulse 76, temperature 99.2 F (37.3 C), temperature source Oral, resp. rate 18, height 6' 5"  (1.956 m), weight 112.401 kg (247 lb 12.8 oz), SpO2 99 %. Physical Exam  Vitals reviewed. Constitutional: He is oriented to person, place, and time. He appears well-developed and well-nourished. No distress.  HENT:  Head: Normocephalic and atraumatic.  Right Ear: External ear normal.  Left Ear: External ear normal.  Eyes: Conjunctivae are normal. No scleral icterus.  Neck: Normal range of motion. Neck supple. No tracheal deviation present. No thyromegaly present.  Cardiovascular: Normal rate and normal heart sounds.   Respiratory: Effort normal and breath sounds normal. No stridor. No respiratory distress. He has no wheezes.  GI: Soft. Normal appearance. He exhibits no distension. There is tenderness in the right lower quadrant. There is guarding and tenderness at McBurney's point. There is no rebound.    Voluntary guarding RLQ, very TTP in RLQ. No generalized peritonitis; small umbilical fascial defect  Musculoskeletal: He exhibits no edema or tenderness.  Lymphadenopathy:    He has no cervical adenopathy.  Neurological: He is alert and oriented to person, place, and time. He exhibits normal muscle tone.  Skin: Skin is warm and dry. No rash noted. He is not diaphoretic. No erythema. No pallor.  Psychiatric: He has a normal mood and affect. His behavior is normal. Judgment and thought content normal.     Assessment/Plan Acute appendicitis Tobacco use Elevated creatinine  We discussed the etiology and management of acute appendicitis. We discussed operative and nonoperative management.  I recommended operative management along with  IV antibiotics.  We discussed laparoscopic appendectomy. We discussed the risk and benefits of surgery including but not limited to bleeding, infection, injury to surrounding structures, need to convert to an open procedure, blood clot formation, post operative abscess or wound infection, staple line complications such as leak or bleeding, hernia formation, post operative ileus, need for additional procedures, anesthesia complications, and the typical postoperative course. I explained that the patient should expect a good improvement in their symptoms.  Repeat BMET this am  Leighton Ruff. Redmond Pulling, MD, FACS General, Bariatric, & Minimally Invasive Surgery [facility], [place]   [facility] M 11/18/2015, 7:10 AM

## 2015-11-18 NOTE — Op Note (Signed)
11/18/2015  10:29 AM  PATIENT:  Anthony Marks  55 y.o. male  PRE-OPERATIVE DIAGNOSIS:  appendicitis  POST-OPERATIVE DIAGNOSIS:  Acute non perforated appendicitis  PROCEDURE:  Procedure(s): APPENDECTOMY LAPAROSCOPIC (N/A)  SURGEON:  Surgeon(s) and Role:    * Axel FillerArmando Guadalupe Kerekes, MD - Primary  ANESTHESIA:   local and general  EBL:  Total I/O In: 750 [I.V.:600; IV Piggyback:150] Out: 160 [Urine:150; Blood:10]  BLOOD ADMINISTERED:none  DRAINS: none   LOCAL MEDICATIONS USED:  BUPIVICAINE   SPECIMEN:  Source of Specimen:  appendix  DISPOSITION OF SPECIMEN:  PATHOLOGY  COUNTS:  YES  TOURNIQUET:  * No tourniquets in log *  DICTATION: .Dragon Dictation Complications: none  Counts: reported as correct x 2  Findings:  The patient had a acutely inflammed non-perforated appendix  Specimen: Appendix  Indications for procedure:  The patient is a 55 year old male with a history of periumbilical pain localized in the right lower quadrant patient had a CT scan which revealed signs consistent with acute appendicitis the patient back in for laparoscopic appendectomy.  Details of the procedure:The patient was taken back to the operating room. The patient was placed in supine position with bilateral SCDs in place.  A foley catheter was place. The patient was prepped and draped in the usual sterile fashion.  After appropriate anitbiotics were confirmed, a time-out was confirmed and all facts were verified.    A pneumoperitoneum of 14 mmHg was obtained via a Veress needle technique in the left lower quadrant quadrant.  A 5 mm trocar and 5 mm camera then placed intra-abdominally there is no injury to any intra-abdominal organs a 10 mm supraumbilical port was placed and direct visualization as was a 5 mm port in the suprapubic area.   The appendix was identified and seen to be non-perforated.  The appendix was cleaned down to the appendiceal base. The mesoappendix was then incised and the  appendiceal artery was cauterized.  The the appendiceal base was clean.  At this time an Endoloop was placed proximallyx2 and one distally and the appendix was transected between these 2. A retrieval bag was then placed into the abdomen and the specimen placed in the bag. The appendiceal stump was cauterized. We evacuate the fluid from the pelvis until the effluent was clear.  The appendix and retrieval  bag was then retrieved via the supraumbilical port. #1 Vicryl was used to reapproximate the fascia at the umbilical port site x1. The skin was reapproximated all port sites 3-0 Monocryl subcuticular fashion. The skin was dressed with steri-strips, guaze, and tape.  The patient had the foley removed. The patient was awakened from general anesthesia was taken to recovery room in stable condition.    PLAN OF CARE: Admit for overnight observation  PATIENT DISPOSITION:  PACU - hemodynamically stable.   Delay start of Pharmacological VTE agent (>24hrs) due to surgical blood loss or risk of bleeding: not applicable

## 2015-11-18 NOTE — ED Notes (Signed)
Doctor at bedside.

## 2015-11-18 NOTE — Transfer of Care (Signed)
Immediate Anesthesia Transfer of Care Note  Patient: Anthony Marks  Procedure(s) Performed: Procedure(s): APPENDECTOMY LAPAROSCOPIC (N/A)  Patient Location: PACU  Anesthesia Type:General  Level of Consciousness: awake, alert  and oriented  Airway & Oxygen Therapy: Patient Spontanous Breathing and Patient connected to face mask oxygen  Post-op Assessment: Report given to RN and Post -op Vital signs reviewed and stable  Post vital signs: stable  Last Vitals:  Filed Vitals:   11/18/15 0720 11/18/15 0847  BP: 123/79 142/80  Pulse: 77 62  Temp: 37.1 C 37.3 C  Resp: 18 18    Complications: No apparent anesthesia complications

## 2015-11-18 NOTE — Anesthesia Procedure Notes (Signed)
Procedure Name: Intubation Date/Time: 11/18/2015 9:54 AM Performed by: Little IshikawaMERCER, Delphine Sizemore L Pre-anesthesia Checklist: Patient identified, Timeout performed, Emergency Drugs available, Suction available and Patient being monitored Patient Re-evaluated:Patient Re-evaluated prior to inductionOxygen Delivery Method: Circle system utilized Preoxygenation: Pre-oxygenation with 100% oxygen Intubation Type: IV induction Ventilation: Mask ventilation without difficulty Laryngoscope Size: Mac and 4 Grade View: Grade II Tube type: Oral Tube size: 7.5 mm Number of attempts: 1 Airway Equipment and Method: Stylet Placement Confirmation: ETT inserted through vocal cords under direct vision,  positive ETCO2 and breath sounds checked- equal and bilateral Secured at: 24 cm Tube secured with: Tape

## 2015-11-18 NOTE — Anesthesia Postprocedure Evaluation (Signed)
Anesthesia Post Note  Patient: Anthony FridayJames Marks  Procedure(s) Performed: Procedure(s) (LRB): APPENDECTOMY LAPAROSCOPIC (N/A)  Patient location during evaluation: PACU Anesthesia Type: General Level of consciousness: awake and alert Pain management: pain level controlled Vital Signs Assessment: post-procedure vital signs reviewed and stable Respiratory status: spontaneous breathing, nonlabored ventilation, respiratory function stable and patient connected to nasal cannula oxygen Cardiovascular status: blood pressure returned to baseline and stable Postop Assessment: no signs of nausea or vomiting Anesthetic complications: no    Last Vitals:  Filed Vitals:   11/18/15 1100 11/18/15 1103  BP: 131/82 125/74  Pulse: 73 71  Temp:  36.6 C  Resp: 20 15    Last Pain:  Filed Vitals:   11/18/15 1106  PainSc: Asleep                 Shelton SilvasKevin D Meldon Hanzlik

## 2015-11-19 MED ORDER — OXYCODONE HCL 5 MG PO TABS: 5.0000 mg | ORAL_TABLET | ORAL | Status: DC | PRN

## 2015-11-19 NOTE — Progress Notes (Signed)
Patient discharged home. Discharge instructions given. Prescription given for pain medication. Pneumonia vaccine was given before discharge.

## 2015-11-19 NOTE — Discharge Summary (Signed)
Physician Discharge Summary [facility]  Patient ID: Anthony Marks MRN: [id number] DOB/AGE: 55-Aug-1962 55 y.o.  Admit date: 11/17/2015 Discharge date: 11/19/2015  Admission Diagnoses:  Acute appendicitis  Discharge Diagnoses:  Active Problems:   New onset atrial fibrillation (HCC)   Acute appendicitis   Discharged Condition: good  Hospital Course: Patient was admitted for observation following lap appendectomy surgery.  Post op course was uncomplicated.  Pain was well controlled.  Tolerated diet.  Patient was prepared for discharge home on POD#1.  Consults: None  Treatments: surgery: laparoscopic appendectomy  Discharge Exam: Blood pressure 112/67, pulse 75, temperature 98.8 F (37.1 C), temperature source Oral, resp. rate 16, height 6\' 5"  (1.956 m), weight 112.401 kg (247 lb 12.8 oz), SpO2 96 %. HEENT - clear Neck - soft Chest - clear bilaterally Cor - RRR Abd - soft without distension; active BS; dressings dry and intact  Disposition: Home  Discharge Instructions    Diet - low sodium heart healthy    Complete by:  As directed      Discharge instructions    Complete by:  As directed   [facility]  LAPAROSCOPIC SURGERY:  POST-OP INSTRUCTIONS  Always review your discharge instruction sheet given to you by the facility where your surgery was performed.  A prescription for pain medication may be given to you upon discharge.  Take your pain medication as prescribed.  If narcotic pain medicine is not needed, then you may take acetaminophen (Tylenol) or ibuprofen (Advil) as needed.  Take your usually prescribed medications unless otherwise directed.  If you need a refill on your pain medication, please contact your pharmacy.  They will contact our office to request authorization. Prescriptions will not be filled after 5 P.M. or on weekends.  You should follow a light diet the first few days after arrival home, such as soup and  crackers or toast.  Be sure to include plenty of fluids daily.  Most patients will experience some swelling and bruising in the area of the incisions.  Ice packs will help.  Swelling and bruising can take several days to resolve.   It is common to experience some constipation after surgery.  Increasing fluid intake and taking a stool softener (such as Colace) will usually help or prevent this problem from occurring.  A mild laxative (Milk of Magnesia or Miralax) should be taken according to package instructions if there has been no bowel movement after 48 hours.  You will have steri-strips and a gauze dressing over your incisions.  You may remove the gauze bandage on the second day after surgery, and you may shower at that time.  Leave your steri-strips (small skin tapes) in place directly over the incision.  These strips should remain on the skin for 5-7 days and then be removed.  You may get them wet in the shower and pat them dry.  Any sutures or staples will be removed at the office during your follow-up visit.  ACTIVITIES:  You may resume regular (light) daily activities beginning the next day - such as daily self-care, walking, climbing stairs - gradually increasing activities as tolerated.  You may have sexual intercourse when it is comfortable.  Refrain from any heavy lifting or straining until approved by your doctor.  You may drive when you are no longer taking prescription pain medication, you can comfortably wear a seatbelt, and you can safely maneuver your car and apply brakes.  You should see your doctor in the  office for a follow-up appointment approximately 2-3 weeks after your surgery.  Make sure that you call for this appointment within a day or two after you arrive home to insure a convenient appointment time.  WHEN TO CALL YOUR DOCTOR: Fever over 101.0 Inability to urinate Continued bleeding from incision Increased pain, redness, or drainage from the incision Increasing  abdominal pain  The clinic staff is available to answer your questions during regular business hours.  Please don't hesitate to call and ask to speak to one of the nurses for clinical concerns.  If you have a medical emergency, go to the nearest emergency room or call 911.  [facility] is always on call for the hospital.  Velora Hecklerodd M. Roni Friberg, MD, [facility] Office: [phone] Toll Free:  [phone] FAX [phone]  Website: [email or web address]     Increase activity slowly    Complete by:  As directed      Remove dressing in 24 hours    Complete by:  As directed             Medication List    TAKE these medications        ALKA-SELTZER GOLD PO  Take 1 tablet by mouth daily as needed. For cold symptoms     diphenhydramine-acetaminophen 25-500 MG Tabs tablet  Commonly known as:  TYLENOL PM  Take 1 tablet by mouth at bedtime as needed.     oxyCODONE 5 MG immediate release tablet  Commonly known as:  Oxy IR/ROXICODONE  Take 1-2 tablets (5-10 mg total) by mouth every 4 (four) hours as needed for moderate pain.     ranitidine 150 MG tablet  Commonly known as:  ZANTAC  Take 150 mg by mouth daily as needed for heartburn.           Follow-up Information    [facility] Schedule an appointment as soon as possible for a visit in 2 weeks.   Specialty:  General Surgery   Why:  For wound re-check   Contact information:   [address] [place] [id number] [phone]      Velora Hecklerodd M. Jaecob Lowden, MD, [facility] Office: [phone]   Signed: Velora HecklerGERKIN,Maye Parkinson M 11/19/2015, 8:04 AM

## 2015-11-21 ENCOUNTER — Encounter (HOSPITAL_COMMUNITY): Payer: Self-pay | Admitting: General Surgery

## 2016-11-23 IMAGING — CT CT ABD-PELV W/ CM
2 of 5 series · 16 of 46 positions shown, 18 images · IV contrast (Omni 300)
Comparison: None.

CLINICAL DATA: Mid abdominal pain radiating to the back.

EXAM:
CT ABDOMEN AND PELVIS WITH CONTRAST
TECHNIQUE: Multidetector CT imaging of the abdomen and pelvis was performed
using the standard protocol following bolus administration of
intravenous contrast.
CONTRAST:  100mL OMNIPAQUE IOHEXOL 300 MG/ML  SOLN

[Series 3: a/p w/ 5mm · axial · 0.78mm/px · z∈[+365,+815]mm · 13 of 102 slices shown, 15 images]
[im 6/102  soft-tissue]
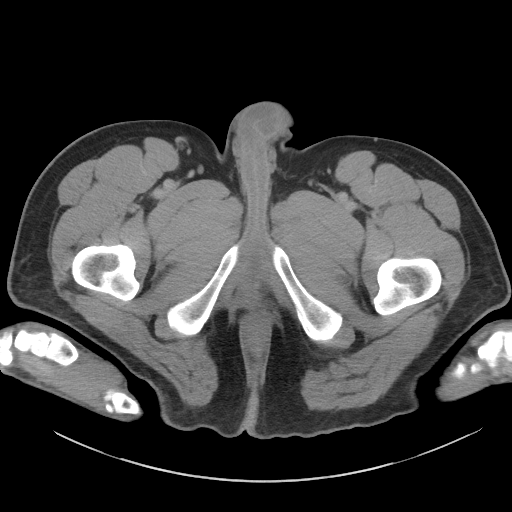
[im 6/102  bone]
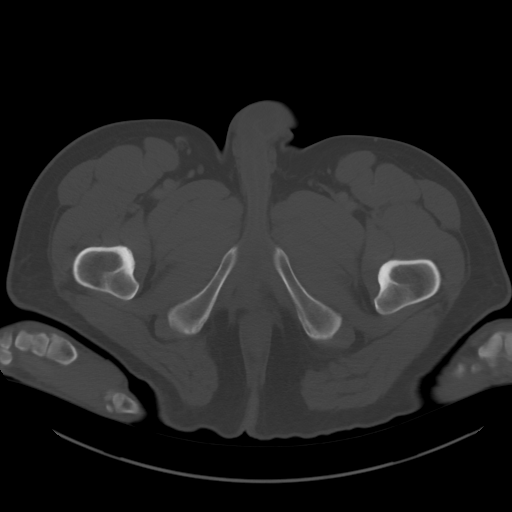
[im 16/102  soft-tissue]
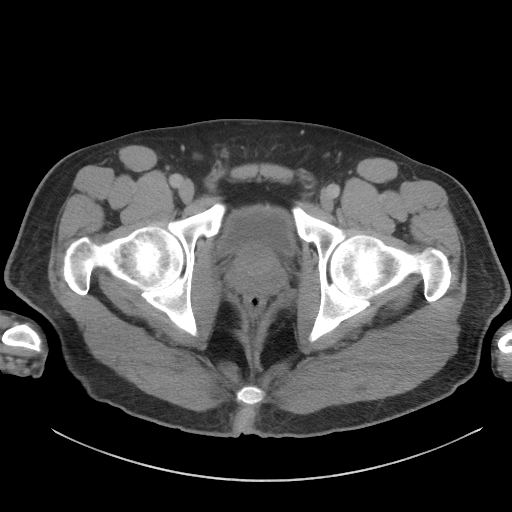
[im 22/102  soft-tissue]
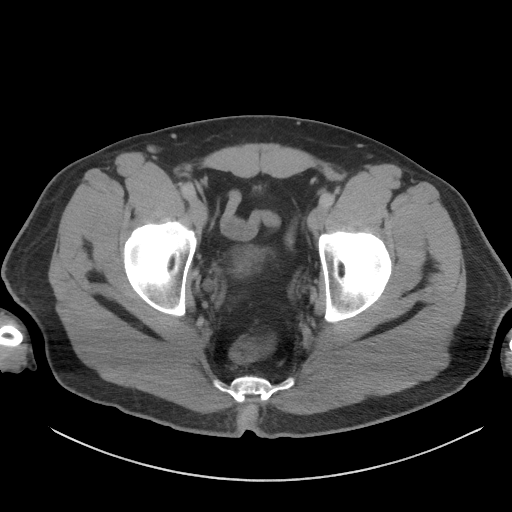
[im 27/102  soft-tissue]
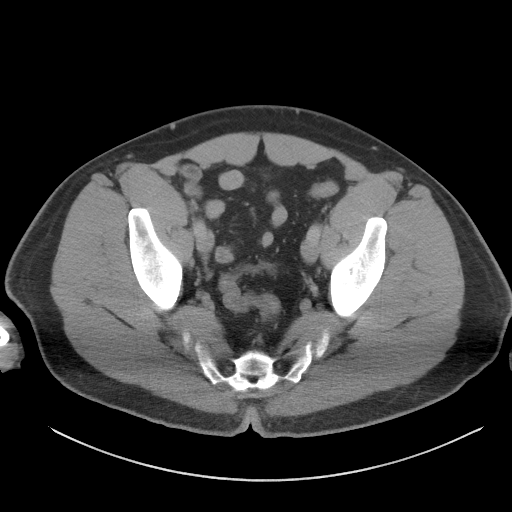
[im 38/102  soft-tissue]
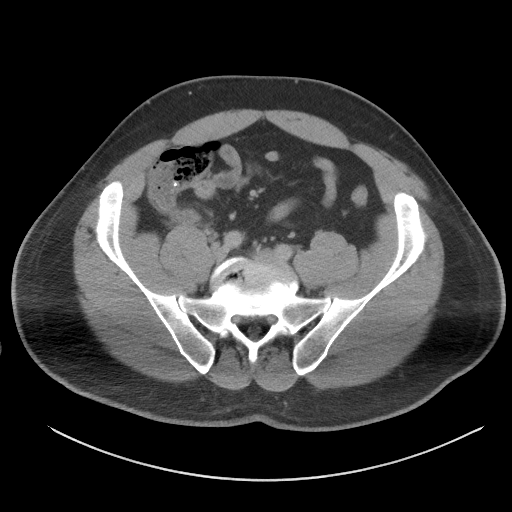
[im 43/102  soft-tissue]
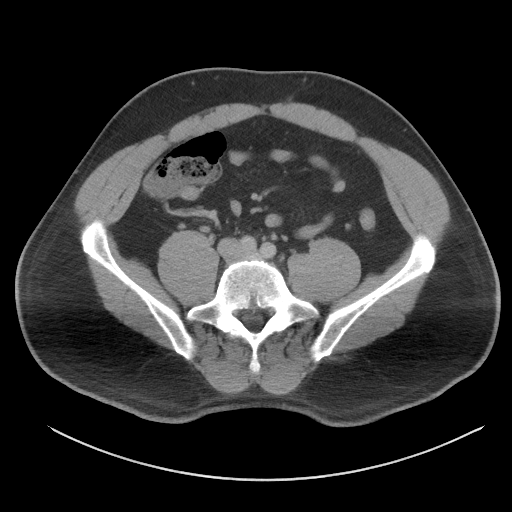
[im 54/102  soft-tissue]
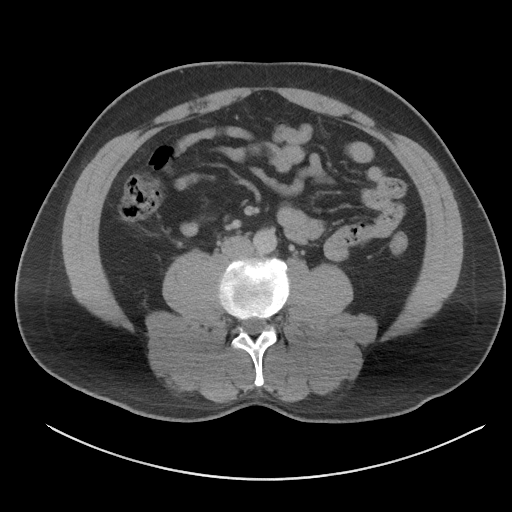
[im 59/102  soft-tissue]
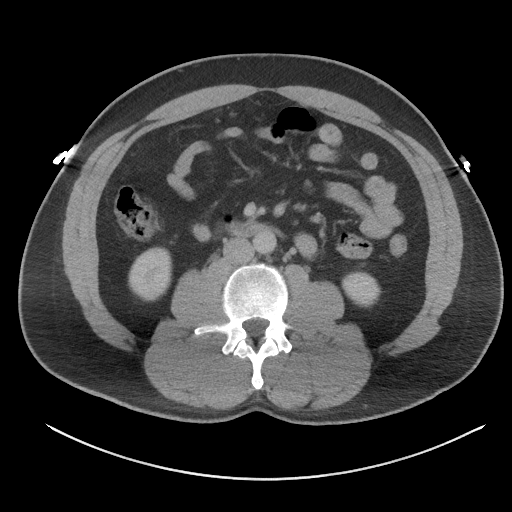
[im 64/102  soft-tissue]
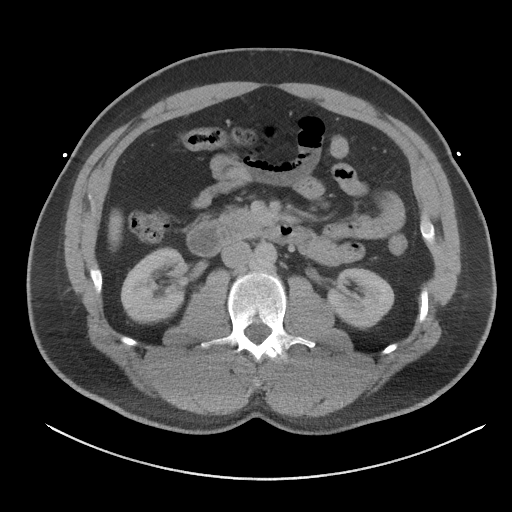
[im 64/102  bone]
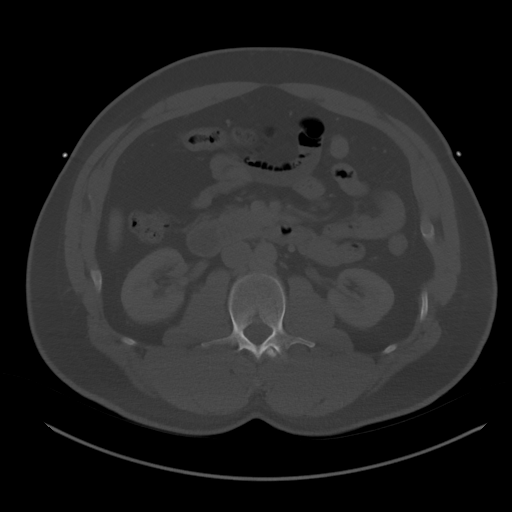
[im 75/102  soft-tissue]
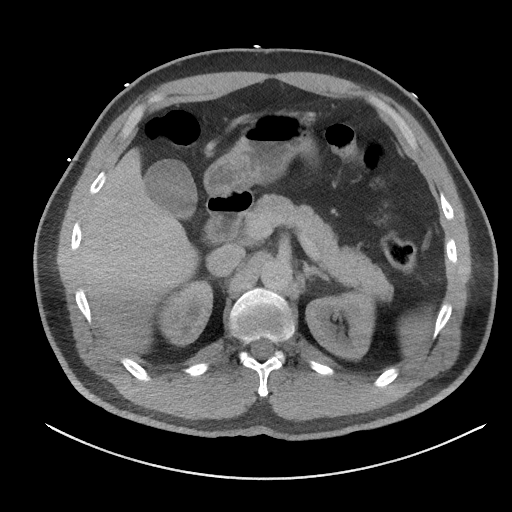
[im 80/102  soft-tissue]
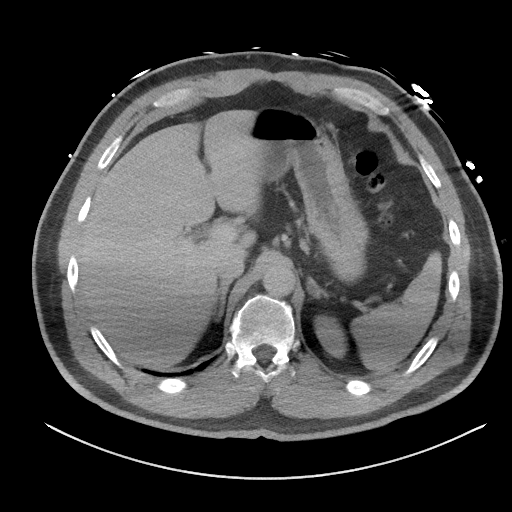
[im 86/102  soft-tissue]
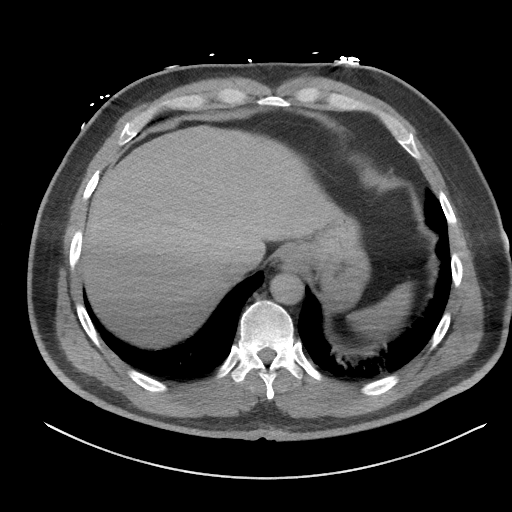
[im 96/102  soft-tissue]
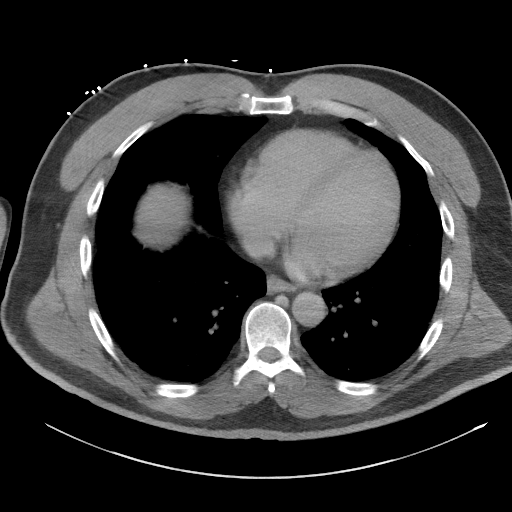

[Series 6: a/p w/ cor · coronal · 0.72mm/px · 3 of 139 slices shown]
[im 47/139  soft-tissue]
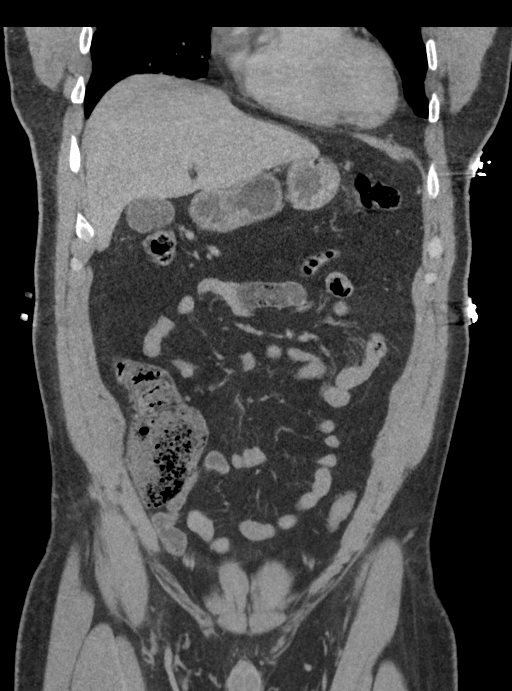
[im 62/139  soft-tissue]
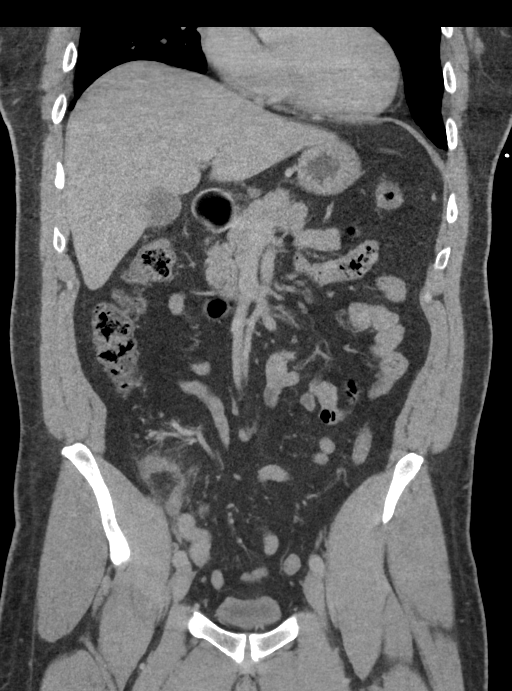
[im 77/139  soft-tissue]
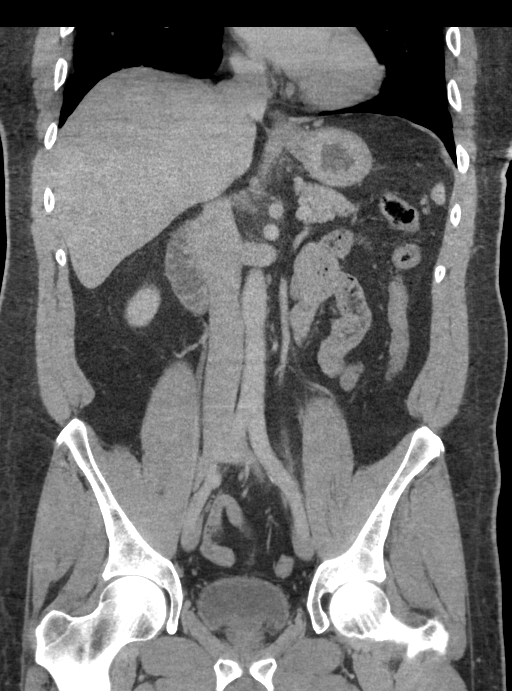

[16 of 46 positions shown; findings below may reference images not displayed]

FINDINGS: Lower chest: Mild atelectasis at the left greater than right lung
base.

Liver: No focal lesion.

Hepatobiliary: Gallbladder physiologically distended, no calcified
stone. No biliary dilatation.

Pancreas: No ductal dilatation or inflammation.

Spleen: Normal.

Adrenal glands: No nodule.  Mild thickening on the left.

Kidneys: Symmetric renal enhancement and excretion. No
hydronephrosis.

Stomach/Bowel: Stomach physiologically distended. There are no
dilated or thickened small bowel loops. Small volume of stool
throughout the colon without colonic wall thickening.

Appendix: Distended measuring up to 14 mm with periappendiceal
inflammation and fat stranding. No perforation or abscess.

Vascular/Lymphatic: No retroperitoneal adenopathy. Abdominal aorta
is normal in caliber.

Reproductive: Prostate gland is normal in size.

Bladder: Minimally distended without wall thickening.

Other: No free air, free fluid, or intra-abdominal fluid collection.
Tiny fat containing umbilical hernia.

Musculoskeletal: There are no acute or suspicious osseous
abnormalities. Degenerative change at L5-S1. Milder degenerative
church at L3-L4.
IMPRESSION: Uncomplicated acute appendicitis.

## 2022-09-03 ENCOUNTER — Encounter (HOSPITAL_COMMUNITY): Payer: Self-pay

## 2022-09-03 ENCOUNTER — Other Ambulatory Visit: Payer: Self-pay

## 2022-09-03 ENCOUNTER — Emergency Department (HOSPITAL_COMMUNITY)
Admission: EM | Admit: 2022-09-03 | Discharge: 2022-09-03 | Disposition: A | Discharge status: Home / Self Care | Payer: 59 | Attending: Emergency Medicine | Admitting: Emergency Medicine

## 2022-09-03 ENCOUNTER — Emergency Department (HOSPITAL_COMMUNITY): Payer: 59

## 2022-09-03 DIAGNOSIS — M545 Low back pain, unspecified: Secondary | ICD-10-CM | POA: Insufficient documentation

## 2022-09-03 DIAGNOSIS — U071 COVID-19: Secondary | ICD-10-CM | POA: Insufficient documentation

## 2022-09-03 DIAGNOSIS — M5431 Sciatica, right side: Secondary | ICD-10-CM

## 2022-09-03 LAB — RESP PANEL BY RT-PCR (FLU A&B, COVID) ARPGX2
Influenza A by PCR: NEGATIVE
Influenza B by PCR: NEGATIVE
SARS Coronavirus 2 by RT PCR: POSITIVE — AB

## 2022-09-03 MED ORDER — METHYLPREDNISOLONE 4 MG PO TBPK: ORAL_TABLET | ORAL | 0 refills | Status: AC

## 2022-09-03 MED ORDER — DEXAMETHASONE SODIUM PHOSPHATE 10 MG/ML IJ SOLN
10.0000 mg | Freq: Once | INTRAMUSCULAR | Status: AC
  Administered 2022-09-03: 10 mg via INTRAMUSCULAR
  Filled 2022-09-03: qty 1

## 2022-09-03 MED ORDER — METHOCARBAMOL 500 MG PO TABS: 500.0000 mg | ORAL_TABLET | Freq: Three times a day (TID) | ORAL | 0 refills | Status: AC | PRN

## 2022-09-03 MED ORDER — OXYCODONE-ACETAMINOPHEN 5-325 MG PO TABS
1.0000 | ORAL_TABLET | Freq: Once | ORAL | Status: AC
  Administered 2022-09-03: 1 via ORAL
  Filled 2022-09-03: qty 1

## 2022-09-03 MED ORDER — OXYCODONE HCL 5 MG PO TABS: 2.5000 mg | ORAL_TABLET | ORAL | 0 refills | Status: AC | PRN

## 2022-09-03 NOTE — ED Triage Notes (Signed)
Patient states right sided back pain that radiates down right leg.  Reports having fever as high as 101.8  Complains of chills.  +diarrhea  Denies urinary symptoms.

## 2022-09-03 NOTE — ED Provider Triage Note (Signed)
Emergency Medicine Provider Triage Evaluation Note  Anthony Marks , a 61 y.o. male  was evaluated in triage.  Patient complains of back pain.  Says is mostly right-sided and over the right buttocks and radiates down his leg.  Says it started on Friday, he works as a Administrator.  No bowel/bladder dysfunction, saddle anesthesia, leg weakness, falls.  Does report a fever to triage nurse but does not tell me this.  Review of Systems  Positive:  Negative:   Physical Exam  BP (!) 126/98 (BP Location: Left Arm)   Pulse 75   Temp 98.4 F (36.9 C) (Oral)   Resp 18   Ht 6\' 5"  (1.956 m)   Wt 112 kg   SpO2 100%   BMI 29.29 kg/m  Gen:   Awake, no distress   Resp:  Normal effort  MSK:   Moves extremities without difficulty  Other:  No midline tenderness.  Some tenderness to palpation over the right buttocks.  Able to ambulate in the room  Medical Decision Making  Medically screening exam initiated at 1:07 PM.  Appropriate orders placed.  Anthony Marks was informed that the remainder of the evaluation will be completed by another provider, this initial triage assessment does not replace that evaluation, and the importance of remaining in the ED until their evaluation is complete.    Pain is refractory to Tylenol   Anthony Marks A, PA-C 09/03/22 1309

## 2022-09-03 NOTE — Discharge Instructions (Addendum)
Get help right away if: You are not able to control when you urinate or have bowel movements (incontinence). You have: Weakness in your lower back, pelvis, buttocks, or legs that gets worse. Redness or swelling of your back. A burning sensation when you urinate. 

## 2022-09-03 NOTE — ED Provider Notes (Signed)
[facility] EMERGENCY DEPARTMENT Provider Note   CSN: [id number] Arrival date & time: 09/03/22  1105     History  Chief Complaint  Patient presents with   Back Pain    Anthony Marks is a 61 y.o. male    The history is provided by the patient.  Back Pain Location:  Lumbar spine and gluteal region Quality:  Aching, shooting and stabbing Radiates to:  R posterior upper leg Pain severity:  Severe Pain is:  Unable to specify Onset quality:  Gradual Duration:  3 days Timing:  Constant Progression:  Worsening Chronicity:  Recurrent Context: not recent injury   Relieved by:  Nothing Worsened by:  Bending, movement, twisting, standing, coughing and ambulation Ineffective treatments:  Bed rest, cold packs and heating pad Associated symptoms: no abdominal pain, no bladder incontinence, no bowel incontinence, no leg pain, no numbness, no perianal numbness, no weakness and no weight loss        Home Medications Prior to Admission medications   Medication Sig Start Date End Date Taking? Authorizing Provider  diphenhydramine-acetaminophen (TYLENOL PM) 25-500 MG TABS tablet Take 1 tablet by mouth at bedtime as needed.    [provider]  oxyCODONE (OXY IR/ROXICODONE) 5 MG immediate release tablet Take 1-2 tablets (5-10 mg total) by mouth every 4 (four) hours as needed for moderate pain. 11/19/15   Darnell Level, MD  ranitidine (ZANTAC) 150 MG tablet Take 150 mg by mouth daily as needed for heartburn.    [provider]  Sodium & Potassium Bicarbonate (ALKA-SELTZER GOLD PO) Take 1 tablet by mouth daily as needed. For cold symptoms    [provider]      Allergies    Ibuprofen and Aspirin    Review of Systems   Review of Systems  Constitutional:  Negative for weight loss.  Gastrointestinal:  Negative for abdominal pain and bowel incontinence.  Genitourinary:  Negative for bladder incontinence.  Musculoskeletal:  Positive for back  pain.  Neurological:  Negative for weakness and numbness.    Physical Exam Updated Vital Signs BP (!) 126/98 (BP Location: Left Arm)   Pulse 75   Temp 98.4 F (36.9 C) (Oral)   Resp 18   Ht 6\' 5"  (1.956 m)   Wt 112 kg   SpO2 100%   BMI 29.29 kg/m  Physical Exam Vitals and nursing note reviewed.  Constitutional:      General: He is not in acute distress.    Appearance: He is well-developed. He is not diaphoretic.  HENT:     Head: Normocephalic and atraumatic.  Eyes:     General: No scleral icterus.    Conjunctiva/sclera: Conjunctivae normal.  Cardiovascular:     Rate and Rhythm: Normal rate and regular rhythm.     Heart sounds: Normal heart sounds.  Pulmonary:     Effort: Pulmonary effort is normal. No respiratory distress.     Breath sounds: Normal breath sounds.  Abdominal:     Palpations: Abdomen is soft.     Tenderness: There is no abdominal tenderness.  Musculoskeletal:     Cervical back: Normal range of motion and neck supple.     Comments: Patient appears to be in mild to moderate pain, antalgic gait noted. Lumbosacral spine area reveals no local tenderness or mass. Painful and reduced LS ROM noted. Straight leg raise is positive at 20 degrees on right. DTR's, motor strength and sensation normal, including heel and toe gait.  Peripheral pulses are palpable.  Skin:    General: Skin is warm and dry.  Neurological:     Mental Status: He is alert.  Psychiatric:        Behavior: Behavior normal.     ED Results / Procedures / Treatments   Labs (all labs ordered are listed, but only abnormal results are displayed) Labs Reviewed - No data to display  EKG None  Radiology DG Lumbar Spine Complete  Result Date: 09/03/2022 CLINICAL DATA:  Pain EXAM: LUMBAR SPINE - COMPLETE 4+ VIEW COMPARISON:  11/18/2015 FINDINGS: There is no evidence of lumbar spine fracture. Alignment is normal. Mild disc height loss L3-4. Chronic endplate sclerosis and mild spurring at L5-S1.  Minimal lower lumbar facet arthropathy. IMPRESSION: Mild degenerative changes of the lumbar spine. No acute findings. Electronically Signed   By: Davina Poke D.O.   On: 09/03/2022 14:11    Procedures Procedures    Medications Ordered in ED Medications - No data to display  ED Course/ Medical Decision Making/ A&P                           Medical Decision Making Patient with back pain.  No neurological deficits and normal neuro exam.  Patient can walk but states is painful.  No loss of bowel or bladder control.  No concern for cauda equina.  Subjective fever at home,will swab for covid.  RICE protocol and pain medicine indicated and discussed with patient.    Amount and/or Complexity of Data Reviewed Radiology: independent interpretation performed.    Details: I visualized and interpreted lumbar film, no acute findings.          Final Clinical Impression(s) / ED Diagnoses Final diagnoses:  None    Rx / DC Orders ED Discharge Orders     None         Margarita Mail, PA-C 09/03/22 1614    Audley Hose, MD 09/03/22 1622

## 2022-10-27 DIAGNOSIS — H6503 Acute serous otitis media, bilateral: Secondary | ICD-10-CM | POA: Diagnosis not present

## 2024-10-09 DIAGNOSIS — R030 Elevated blood-pressure reading, without diagnosis of hypertension: Secondary | ICD-10-CM | POA: Diagnosis not present

## 2024-10-09 DIAGNOSIS — K029 Dental caries, unspecified: Secondary | ICD-10-CM | POA: Diagnosis not present

## 2024-10-09 DIAGNOSIS — K219 Gastro-esophageal reflux disease without esophagitis: Secondary | ICD-10-CM | POA: Diagnosis not present

## 2024-10-09 DIAGNOSIS — F17210 Nicotine dependence, cigarettes, uncomplicated: Secondary | ICD-10-CM | POA: Diagnosis not present

## 2024-10-09 DIAGNOSIS — Z79899 Other long term (current) drug therapy: Secondary | ICD-10-CM | POA: Diagnosis not present

## 2024-10-09 DIAGNOSIS — K0889 Other specified disorders of teeth and supporting structures: Secondary | ICD-10-CM | POA: Diagnosis not present

## 2024-10-09 DIAGNOSIS — Z634 Disappearance and death of family member: Secondary | ICD-10-CM | POA: Diagnosis not present
